# Patient Record
Sex: Male | Born: 1970 | Race: White | Hispanic: No | Marital: Married | Smoking: Never smoker
Health system: Southern US, Community
[De-identification: ages and names within clinical notes are randomized; demographics above are authoritative.]

## PROBLEM LIST (undated history)

## (undated) DIAGNOSIS — E785 Hyperlipidemia, unspecified: Secondary | ICD-10-CM

## (undated) DIAGNOSIS — F32A Depression, unspecified: Secondary | ICD-10-CM

## (undated) DIAGNOSIS — J189 Pneumonia, unspecified organism: Secondary | ICD-10-CM

## (undated) DIAGNOSIS — F329 Major depressive disorder, single episode, unspecified: Secondary | ICD-10-CM

## (undated) HISTORY — PX: VASECTOMY: SHX75

## (undated) HISTORY — PX: MYRINGOTOMY: SUR874

## (undated) HISTORY — DX: Hyperlipidemia, unspecified: E78.5

## (undated) HISTORY — DX: Major depressive disorder, single episode, unspecified: F32.9

## (undated) HISTORY — DX: Depression, unspecified: F32.A

---

## 2004-02-10 ENCOUNTER — Ambulatory Visit: Payer: Self-pay | Admitting: Internal Medicine

## 2004-03-19 ENCOUNTER — Ambulatory Visit: Payer: Self-pay | Admitting: Internal Medicine

## 2004-06-12 ENCOUNTER — Ambulatory Visit: Payer: Self-pay | Admitting: Internal Medicine

## 2005-05-08 ENCOUNTER — Ambulatory Visit: Payer: Self-pay | Admitting: Internal Medicine

## 2006-05-20 ENCOUNTER — Ambulatory Visit (HOSPITAL_COMMUNITY): Admission: RE | Admit: 2006-05-20 | Discharge: 2006-05-20 | Payer: Self-pay | Admitting: Orthopedic Surgery

## 2006-12-09 ENCOUNTER — Ambulatory Visit: Payer: Self-pay | Admitting: Internal Medicine

## 2006-12-09 DIAGNOSIS — E785 Hyperlipidemia, unspecified: Secondary | ICD-10-CM

## 2006-12-09 LAB — CONVERTED CEMR LAB
ALT: 20 units/L (ref 0–53)
AST: 21 units/L (ref 0–37)
Albumin: 4.3 g/dL (ref 3.5–5.2)
Alkaline Phosphatase: 55 units/L (ref 39–117)
BUN: 14 mg/dL (ref 6–23)
Bilirubin, Direct: 0.1 mg/dL (ref 0.0–0.3)
CO2: 26 meq/L (ref 19–32)
Calcium: 9.3 mg/dL (ref 8.4–10.5)
Chloride: 105 meq/L (ref 96–112)
Cholesterol: 146 mg/dL (ref 0–200)
Creatinine, Ser: 1 mg/dL (ref 0.4–1.5)
GFR calc Af Amer: 108 mL/min
GFR calc non Af Amer: 89 mL/min
Glucose, Bld: 132 mg/dL — ABNORMAL HIGH (ref 70–99)
HDL: 34.9 mg/dL — ABNORMAL LOW (ref >39.0)
LDL Cholesterol: 90 mg/dL (ref 0–99)
Phosphorus: 3.9 mg/dL (ref 2.3–4.6)
Potassium: 4 meq/L (ref 3.5–5.1)
Sodium: 138 meq/L (ref 135–145)
Total Bilirubin: 0.6 mg/dL (ref 0.3–1.2)
Total CHOL/HDL Ratio: 4.2
Total Protein: 6.9 g/dL (ref 6.0–8.3)
Triglycerides: 107 mg/dL (ref 0–149)
VLDL: 21 mg/dL (ref 0–40)

## 2007-01-25 ENCOUNTER — Telehealth: Payer: Self-pay | Admitting: *Deleted

## 2008-04-15 ENCOUNTER — Ambulatory Visit: Payer: Self-pay | Admitting: Internal Medicine

## 2008-04-18 ENCOUNTER — Ambulatory Visit: Payer: Self-pay | Admitting: Internal Medicine

## 2008-11-25 ENCOUNTER — Ambulatory Visit: Payer: Self-pay | Admitting: Internal Medicine

## 2008-11-25 DIAGNOSIS — F341 Dysthymic disorder: Secondary | ICD-10-CM | POA: Insufficient documentation

## 2008-11-26 ENCOUNTER — Encounter: Payer: Self-pay | Admitting: Internal Medicine

## 2008-11-26 LAB — CONVERTED CEMR LAB
ALT: 20 units/L (ref 0–53)
AST: 20 units/L (ref 0–37)
Albumin: 4.1 g/dL (ref 3.5–5.2)
Alkaline Phosphatase: 63 units/L (ref 39–117)
Bilirubin, Direct: 0 mg/dL (ref 0.0–0.3)
Cholesterol: 214 mg/dL — ABNORMAL HIGH (ref 0–200)
Total Bilirubin: 1 mg/dL (ref 0.3–1.2)
Total CHOL/HDL Ratio: 5
Triglycerides: 63 mg/dL (ref 0.0–149.0)

## 2010-02-11 ENCOUNTER — Ambulatory Visit: Payer: Self-pay | Admitting: Internal Medicine

## 2010-02-11 LAB — CONVERTED CEMR LAB
Bilirubin Urine: NEGATIVE
WBC Urine, dipstick: NEGATIVE
pH: 7

## 2010-02-13 LAB — CONVERTED CEMR LAB
ALT: 18 units/L (ref 0–53)
AST: 21 units/L (ref 0–37)
BUN: 14 mg/dL (ref 6–23)
Basophils Absolute: 0 10*3/uL (ref 0.0–0.1)
Bilirubin, Direct: 0.1 mg/dL (ref 0.0–0.3)
Calcium: 9.4 mg/dL (ref 8.4–10.5)
Cholesterol: 220 mg/dL — ABNORMAL HIGH (ref 0–200)
Direct LDL: 146 mg/dL
Eosinophils Absolute: 0 10*3/uL (ref 0.0–0.7)
Eosinophils Relative: 0.4 % (ref 0.0–5.0)
GFR calc non Af Amer: 98.79 mL/min (ref >60)
Lymphocytes Relative: 22.6 % (ref 12.0–46.0)
Lymphs Abs: 1.4 10*3/uL (ref 0.7–4.0)
MCV: 92.7 fL (ref 78.0–100.0)
Monocytes Relative: 6 % (ref 3.0–12.0)
Neutro Abs: 4.3 10*3/uL (ref 1.4–7.7)
Neutrophils Relative %: 70.5 % (ref 43.0–77.0)
Platelets: 290 10*3/uL (ref 150.0–400.0)
Potassium: 4.4 meq/L (ref 3.5–5.1)
Total Bilirubin: 0.7 mg/dL (ref 0.3–1.2)
Total Protein: 6.7 g/dL (ref 6.0–8.3)
Triglycerides: 219 mg/dL — ABNORMAL HIGH (ref 0.0–149.0)
VLDL: 43.8 mg/dL — ABNORMAL HIGH (ref 0.0–40.0)

## 2010-03-09 ENCOUNTER — Telehealth: Payer: Self-pay | Admitting: Internal Medicine

## 2010-05-05 NOTE — Progress Notes (Signed)
  Phone Note Call from Patient   Caller: Patient Call For: Birdie Sons MD Summary of Call: Pt started back on Wellbutrin and it is helping and would like to refill it. Initial call taken by: [facility] CMA AAMA,  March 09, 2010 3:47 PM  Follow-up for Phone Call        ok Follow-up by: Birdie Sons MD,  March 09, 2010 3:57 PM    New/Updated Medications: WELLBUTRIN XL 300 MG XR24H-TAB (BUPROPION HCL) one by mouth daily Prescriptions: WELLBUTRIN XL 300 MG XR24H-TAB (BUPROPION HCL) one by mouth daily  #90 x 3   Entered by:   Lynann Beaver CMA AAMA   Authorized by:   Birdie Sons MD   Signed by:   Lynann Beaver CMA AAMA on 03/09/2010   Method used:   Electronically to        [facility]. [phone]* (retail)       [address]       [place], [place]  [address]       Ph: [phone]       Fax: [phone]   RxID:   [id number]

## 2010-05-05 NOTE — Assessment & Plan Note (Signed)
Summary: CPX (PT WILL COME IN FASTING) // RS   Vital Signs:  Patient profile:   40 year old male Height:      71 inches Weight:      193 pounds BMI:     27.02 Temp:     98.6 degrees F oral Pulse rate:   84 / minute Pulse rhythm:   regular BP sitting:   106 / 72  (left arm) Cuff size:   large  Vitals Entered By: Alfred Levins, CMA (February 11, 2010 9:16 AM) CC: cpx, fasting   CC:  cpx and fasting.  History of Present Illness: cpx  Current Problems (verified): 1)  Dysthymia  (ICD-300.4) 2)  Hyperlipidemia  (ICD-272.4)  Current Medications (verified): 1)  None  Allergies (verified): No Known Drug Allergies  Past History:  Past Medical History: Last updated: 04/15/2008  Hyperlipidemia  Past Surgical History: Last updated: 11/24/2006 Myringotomy  Family History: Last updated: 02/11/2010 Family History of Cardiovascular disorder: father MIx2 with stents  Social History: Last updated: 02/11/2010 Occupation: Airline pilot Married Never Smoked Drug use-no  Risk Factors: Smoking Status: never (11/24/2006)  Family History: Family History of Cardiovascular disorder: father MIx2 with stents  Social History: Occupation: Airline pilot Married Never Smoked Drug use-no  Physical Exam  General:  well-developed well-nourished male in no acute distress. HEENT exam atraumatic, normocephalic symmetric muscles are intact. Neck is supple without lymphadenopathy, thyromegaly, jugular venous distention or carotid bruits. Chest clear to auscultation without increased work or breathing. Cardiac exam S1-S2 are regular. Abdominal exam active bowel sounds, soft, nontender. Extremities there is no clubbing cyanosis or edema. Gait is normal. Neurologic exam is alert and oriented no motor or sensory deficits noted.   Impression & Recommendations:  Problem # 1:  Preventive Health Care (ICD-V70.0)  health maint utd advised 20 pound weight loss  advised daily exercise and low calorie  diet  Orders: Venipuncture (16109) UA Dipstick w/o Micro (automated)  (81003) Specimen Handling ([address]) TLB-Lipid Panel (80061-LIPID) TLB-BMP (Basic Metabolic Panel-BMET) (80048-METABOL) TLB-CBC Platelet - w/Differential (85025-CBCD) TLB-Hepatic/Liver Function Pnl (80076-HEPATIC) TLB-TSH (Thyroid Stimulating Hormone) (84443-TSH)  Problem # 2:  HYPERLIPIDEMIA (ICD-272.4) advised regular exercise and low-fat diet. Labs Reviewed: SGOT: 20 (11/25/2008)   SGPT: 20 (11/25/2008)   HDL:42.70 (11/25/2008), 34.9 (12/09/2006)  LDL:90 (12/09/2006)  Chol:214 (11/25/2008), 146 (12/09/2006)  Trig:63.0 (11/25/2008), 107 (12/09/2006)  Problem # 3:  DYSTHYMIA (ICD-300.4) He really describes a pattern of worry. no treatment at this time  Other Orders: Admin 1st Vaccine (09811) Flu Vaccine 30yrs + [phone])   Orders Added: 1)  Admin 1st Vaccine [90471] 2)  Flu Vaccine 23yrs + [90658] 3)  Est. Patient 40-64 years [99396] 4)  Venipuncture [36415] 5)  UA Dipstick w/o Micro (automated)  [81003] 6)  Specimen Handling [99000] 7)  TLB-Lipid Panel [80061-LIPID] 8)  TLB-BMP (Basic Metabolic Panel-BMET) [80048-METABOL] 9)  TLB-CBC Platelet - w/Differential [85025-CBCD] 10)  TLB-Hepatic/Liver Function Pnl [80076-HEPATIC] 11)  TLB-TSH (Thyroid Stimulating Hormone) [29562-ZHY] Flu Vaccine Consent Questions     Do you have a history of severe allergic reactions to this vaccine? no    Any prior history of allergic reactions to egg and/or gelatin? no    Do you have a sensitivity to the preservative Thimersol? no    Do you have a past history of Guillan-Barre Syndrome? no    Do you currently have an acute febrile illness? no    Have you ever had a severe reaction to latex? no    Vaccine information given and explained  to patient? yes    Are you currently pregnant? no    Lot Number:[id number]   Exp Date:10/03/2010   Site Given  Left Deltoid IM   .[email or web address]   Laboratory Results   Urine Tests  Date/Time  Recieved: February 11, 2010 12:59 PM  Date/Time Reported: February 11, 2010 12:59 PM   Routine Urinalysis   Color: yellow Appearance: Clear Glucose: negative   (Normal Range: Negative) Bilirubin: negative   (Normal Range: Negative) Ketone: negative   (Normal Range: Negative) Spec. Gravity: 1.020   (Normal Range: 1.003-1.035) Blood: negative   (Normal Range: Negative) pH: 7.0   (Normal Range: 5.0-8.0) Protein: trace   (Normal Range: Negative) Urobilinogen: 0.2   (Normal Range: 0-1) Nitrite: negative   (Normal Range: Negative) Leukocyte Esterace: negative   (Normal Range: Negative)    Comments: Wynona Canes, CMA  February 11, 2010 12:59 PM

## 2010-07-23 ENCOUNTER — Encounter: Payer: Self-pay | Admitting: Internal Medicine

## 2010-07-24 ENCOUNTER — Ambulatory Visit (INDEPENDENT_AMBULATORY_CARE_PROVIDER_SITE_OTHER): Payer: 59 | Admitting: Internal Medicine

## 2010-07-24 ENCOUNTER — Encounter: Payer: Self-pay | Admitting: Internal Medicine

## 2010-07-24 VITALS — BP 122/80 | HR 72 | Temp 98.6°F | Ht 70.5 in | Wt 172.0 lb

## 2010-07-24 DIAGNOSIS — F341 Dysthymic disorder: Secondary | ICD-10-CM

## 2010-07-24 MED ORDER — VENLAFAXINE HCL ER 75 MG PO CP24: 75.0000 mg | ORAL_CAPSULE | Freq: Every day | ORAL | Status: AC

## 2010-07-24 MED ORDER — ALPRAZOLAM 0.25 MG PO TABS: 0.2500 mg | ORAL_TABLET | Freq: Every day | ORAL | Status: AC | PRN

## 2010-07-24 NOTE — Assessment & Plan Note (Addendum)
Will try to change meds. See new medication list. Side effects discussed. He will taper off the Wellbutrin.

## 2010-07-24 NOTE — Progress Notes (Signed)
  Subjective:    Patient ID: Devin Brown, male    DOB: 24-Oct-1970, 40 y.o.   MRN: [id number]  HPI F/u depression Feeling o.k. Mood is somewhat better but maybe not helping as much as he would like. Complains of cost of medication. Patient denies any suicidal thoughts ideations. He feels generally anxious.   Past Medical History  Diagnosis Date  . Hyperlipidemia   . Depression    Past Surgical History  Procedure Date  . Myringotomy     reports that he has never smoked. He does not have any smokeless tobacco history on file. He reports that he drinks alcohol. He reports that he does not use illicit drugs. family history includes Heart disease in his father and Hyperlipidemia in his mother. No Known Allergies   Review of Systems  patient denies chest pain, shortness of breath, orthopnea. Denies lower extremity edema, abdominal pain, change in appetite, change in bowel movements. Patient denies rashes, musculoskeletal complaints. No other specific complaints in a complete review of systems.       Objective:   Physical Exam Well-developed male in no acute distress. HEENT exam atraumatic, normocephalic, neck supple. Chest clear to auscultation, cardiac exam S1-S2 are regular. Extremities no edema.       Assessment & Plan:

## 2011-02-02 ENCOUNTER — Other Ambulatory Visit: Payer: 59

## 2011-02-16 ENCOUNTER — Encounter: Payer: 59 | Admitting: Internal Medicine

## 2011-02-24 ENCOUNTER — Encounter: Payer: 59 | Admitting: Internal Medicine

## 2014-04-28 ENCOUNTER — Emergency Department (HOSPITAL_COMMUNITY)
Admission: EM | Admit: 2014-04-28 | Discharge: 2014-04-28 | Disposition: A | Discharge status: Home / Self Care | Payer: No Typology Code available for payment source | Attending: Emergency Medicine | Admitting: Emergency Medicine

## 2014-04-28 ENCOUNTER — Encounter (HOSPITAL_COMMUNITY): Payer: Self-pay | Admitting: Emergency Medicine

## 2014-04-28 ENCOUNTER — Emergency Department (HOSPITAL_COMMUNITY): Payer: No Typology Code available for payment source

## 2014-04-28 DIAGNOSIS — S4991XA Unspecified injury of right shoulder and upper arm, initial encounter: Secondary | ICD-10-CM | POA: Diagnosis present

## 2014-04-28 DIAGNOSIS — Y9323 Activity, snow (alpine) (downhill) skiing, snow boarding, sledding, tobogganing and snow tubing: Secondary | ICD-10-CM | POA: Diagnosis not present

## 2014-04-28 DIAGNOSIS — Z8639 Personal history of other endocrine, nutritional and metabolic disease: Secondary | ICD-10-CM | POA: Diagnosis not present

## 2014-04-28 DIAGNOSIS — S43004A Unspecified dislocation of right shoulder joint, initial encounter: Secondary | ICD-10-CM

## 2014-04-28 DIAGNOSIS — Y998 Other external cause status: Secondary | ICD-10-CM | POA: Insufficient documentation

## 2014-04-28 DIAGNOSIS — Y92828 Other wilderness area as the place of occurrence of the external cause: Secondary | ICD-10-CM | POA: Insufficient documentation

## 2014-04-28 DIAGNOSIS — S43101A Unspecified dislocation of right acromioclavicular joint, initial encounter: Secondary | ICD-10-CM | POA: Insufficient documentation

## 2014-04-28 DIAGNOSIS — Z8659 Personal history of other mental and behavioral disorders: Secondary | ICD-10-CM | POA: Insufficient documentation

## 2014-04-28 DIAGNOSIS — Z79899 Other long term (current) drug therapy: Secondary | ICD-10-CM | POA: Insufficient documentation

## 2014-04-28 MED ORDER — HYDROCODONE-ACETAMINOPHEN 5-325 MG PO TABS: 1.0000 | ORAL_TABLET | Freq: Four times a day (QID) | ORAL | Status: DC | PRN

## 2014-04-28 NOTE — Discharge Instructions (Signed)
Acromioclavicular Injuries °The AC (acromioclavicular) joint is the joint in the shoulder where the collarbone (clavicle) meets the shoulder blade (scapula). The part of the shoulder blade connected to the collarbone is called the acromion. Common problems with and treatments for the AC joint are detailed below. °ARTHRITIS °Arthritis occurs when the joint has been injured and the smooth padding between the joints (cartilage) is lost. This is the wear and tear seen in most joints of the body if they have been overused. This causes the joint to produce pain and swelling which is worse with activity.  °AC JOINT SEPARATION °AC joint separation means that the ligaments connecting the acromion of the shoulder blade and collarbone have been damaged, and the two bones no longer line up. AC separations can be anywhere from mild to severe, and are "graded" depending upon which ligaments are torn and how badly they are torn. °· Grade I Injury: the least damage is done, and the AC joint still lines up. °· Grade II Injury: damage to the ligaments which reinforce the AC joint. In a Grade II injury, these ligaments are stretched but not entirely torn. When stressed, the AC joint becomes painful and unstable. °· Grade III Injury: AC and secondary ligaments are completely torn, and the collarbone is no longer attached to the shoulder blade. This results in deformity; a prominence of the end of the clavicle. °AC JOINT FRACTURE °AC joint fracture means that there has been a break in the bones of the AC joint, usually the end of the clavicle. °TREATMENT °TREATMENT OF AC ARTHRITIS °· There is currently no way to replace the cartilage damaged by arthritis. The best way to improve the condition is to decrease the activities which aggravate the problem. Application of ice to the joint helps decrease pain and soreness (inflammation). The use of non-steroidal anti-inflammatory medication is helpful. °· If less conservative measures do not  work, then cortisone shots (injections) may be used. These are anti-inflammatories; they decrease the soreness in the joint and swelling. °· If non-surgical measures fail, surgery may be recommended. The procedure is generally removal of a portion of the end of the clavicle. This is the part of the collarbone closest to your acromion which is stabilized with ligaments to the acromion of the shoulder blade. This surgery may be performed using a tube-like instrument with a light (arthroscope) for looking into a joint. It may also be performed as an open surgery through a small incision by the surgeon. Most patients will have good range of motion within 6 weeks and may return to all activity including sports by 8-12 weeks, barring complications. °TREATMENT OF AN AC SEPARATION °· The initial treatment is to decrease pain. This is best accomplished by immobilizing the arm in a sling and placing an ice pack to the shoulder for 20 to 30 minutes every 2 hours as needed. As the pain starts to subside, it is important to begin moving the fingers, wrist, elbow and eventually the shoulder in order to prevent a stiff or "frozen" shoulder. Instruction on when and how much to move the shoulder will be provided by your caregiver. The length of time needed to regain full motion and function depends on the amount or grade of the injury. Recovery from a Grade I AC separation usually takes 10 to 14 days, whereas a Grade III may take 6 to 8 weeks. °· Grade I and II separations usually do not require surgery. Even Grade III injuries usually allow return to full   activity with few restrictions. Treatment is also based on the activity demands of the injured shoulder. For example, a high level quarterback with an injured throwing arm will receive more aggressive treatment than someone with a desk job who rarely uses his/her arm for strenuous activities. In some cases, a painful lump may persist which could require a later surgery. Surgery  can be very successful, but the benefits must be weighed against the potential risks. °TREATMENT OF AN AC JOINT FRACTURE °Fracture treatment depends on the type of fracture. Sometimes a splint or sling may be all that is required. Other times surgery may be required for repair. This is more frequently the case when the ligaments supporting the clavicle are completely torn. Your caregiver will help you with these decisions and together you can decide what will be the best treatment. °HOME CARE INSTRUCTIONS  °· Apply ice to the injury for 15-20 minutes each hour while awake for 2 days. Put the ice in a plastic bag and place a towel between the bag of ice and skin. °· If a sling has been applied, wear it constantly for as long as directed by your caregiver, even at night. The sling or splint can be removed for bathing or showering or as directed. Be sure to keep the shoulder in the same place as when the sling is on. Do not lift the arm. °· If a figure-of-eight splint has been applied it should be tightened gently by another person every day. Tighten it enough to keep the shoulders held back. Allow enough room to place the index finger between the body and strap. Loosen the splint immediately if there is numbness or tingling in the hands. °· Take over-the-counter or prescription medicines for pain, discomfort or fever as directed by your caregiver. °· If you or your child has received a follow up appointment, it is very important to keep that appointment in order to avoid long term complications, chronic pain or disability. °SEEK MEDICAL CARE IF:  °· The pain is not relieved with medications. °· There is increased swelling or discoloration that continues to get worse rather than better. °· You or your child has been unable to follow up as instructed. °· There is progressive numbness and tingling in the arm, forearm or hand. °SEEK IMMEDIATE MEDICAL CARE IF:  °· The arm is numb, cold or pale. °· There is increasing pain  in the hand, forearm or fingers. °MAKE SURE YOU:  °· Understand these instructions. °· Will watch your condition. °· Will get help right away if you are not doing well or get worse. °Document Released: 12/30/2004 Document Revised: 06/14/2011 Document Reviewed: 06/24/2008 °ExitCare® Patient Information ©2015 [employer]. This information is not intended to replace advice given to you by your health care provider. Make sure you discuss any questions you have with your health care provider. ° °

## 2014-04-28 NOTE — ED Notes (Signed)
Skiing Friday, went head over heels, landed on right shoulder. [place] and has not been seen for shoulder yet. Pt states he feels like the bone is sticking up out of his shoulder, slight deformity to rt shoulder joint.

## 2014-04-28 NOTE — ED Notes (Signed)
Called pt for room. No answer.  

## 2014-04-28 NOTE — ED Provider Notes (Signed)
CSN: [id number]     Arrival date & time 04/28/14  1841 History   First MD Initiated Contact with Patient 04/28/14 2107     Chief Complaint  Patient presents with  . Shoulder Injury     (Consider location/radiation/quality/duration/timing/severity/associated sxs/prior Treatment) HPI Comments: Patient resents emergency department with chief complaint of right shoulder pain. States that he fell while skiing on Friday. Complains of right shoulder pain with palpation and movement. He has been managing his symptoms with OTC pain medicines. He is able to move his arm. Denies any other injuries. States pain is mild to moderate.  The history is provided by the patient. No language interpreter was used.    Past Medical History  Diagnosis Date  . Hyperlipidemia   . Depression    Past Surgical History  Procedure Laterality Date  . Myringotomy     Family History  Problem Relation Age of Onset  . Heart disease Father     MI x2 with stents  . Hyperlipidemia Mother    History  Substance Use Topics  . Smoking status: Never Smoker   . Smokeless tobacco: Not on file  . Alcohol Use: Yes    Review of Systems  Constitutional: Negative for fever and chills.  Respiratory: Negative for shortness of breath.   Cardiovascular: Negative for chest pain.  Gastrointestinal: Negative for nausea, vomiting, diarrhea and constipation.  Genitourinary: Negative for dysuria.  Musculoskeletal: Positive for arthralgias.  All other systems reviewed and are negative.     Allergies  Review of patient's allergies indicates no known allergies.  Home Medications   Prior to Admission medications   Medication Sig Start Date End Date Taking? Authorizing Provider  HYDROcodone-acetaminophen (NORCO/VICODIN) 5-325 MG per tablet Take 1-2 tablets by mouth every 6 (six) hours as needed for moderate pain or severe pain. 04/28/14   Roxy Horseman, PA-C  venlafaxine (EFFEXOR XR) 75 MG 24 hr capsule Take 1 capsule (75  mg total) by mouth daily. 07/24/10 07/24/11  Valetta Mole Swords, MD   BP 151/86 mmHg  Pulse 95  Temp(Src) 98.5 F (36.9 C) (Oral)  Resp 18  SpO2 98% Physical Exam  Constitutional: He is oriented to person, place, and time. He appears well-developed and well-nourished.  HENT:  Head: Normocephalic and atraumatic.  Eyes: Conjunctivae and EOM are normal.  Neck: Normal range of motion.  Cardiovascular: Normal rate and intact distal pulses.   Pulmonary/Chest: Effort normal.  Abdominal: He exhibits no distension.  Musculoskeletal: Normal range of motion.  Right clavicle mildly elevated in relation to the remaining shoulder joint, concerning for [facility] joint separation, no other bony abnormality or deformity, range of motion and strength is intact, but patient does exhibit pain with overhead movement and putting hand behind back  Neurological: He is alert and oriented to person, place, and time.  Sensation and strength intact  Skin: Skin is dry.  Psychiatric: He has a normal mood and affect. His behavior is normal. Judgment and thought content normal.  Nursing note and vitals reviewed.   ED Course  Procedures (including critical care time) Labs Review Labs Reviewed - No data to display  Imaging Review Dg Shoulder Right  04/28/2014   CLINICAL DATA:  Initial evaluation for recent skiing injury. Trauma. Now with right shoulder pain.  EXAM: RIGHT SHOULDER - 2+ VIEW  COMPARISON:  None.  FINDINGS: No acute fracture identified. The humeral head in normal alignment with the glenoid. There is elevation of the distal clavicle relative to the acromion with widening  of the cortical clavicular distance to 26 mm. Findings suggestive of acromioclavicular separation/dislocation. No soft tissue abnormality. No radiopaque foreign body. Partially visualized right hemi thorax grossly clear.  IMPRESSION: 1. Elevation of the distal clavicle relative to the acromion with widening of the coracoclavicular distance, most  consistent with [facility] separation/dislocation (type III). 2. No acute fracture.   Electronically Signed   By: Rise MuBenjamin  McClintock M.D.   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 Will place patient a sling. Recommend orthopedic follow-up. Will treat the patient with Vicodin. Recommend NSAIDs and ice. Patient understands agrees with the plan. Instructed the patient to range his shoulder as tolerable twice a day to avoid frozen shoulder.    Roxy Horsemanobert Ryot Burrous, PA-C 04/28/14 [id number]  Purvis SheffieldForrest Harrison, MD 04/29/14 2230

## 2014-05-01 ENCOUNTER — Encounter (HOSPITAL_COMMUNITY): Payer: Self-pay | Admitting: *Deleted

## 2014-05-01 NOTE — Anesthesia Preprocedure Evaluation (Addendum)
Anesthesia Evaluation  Patient identified by MRN, date of birth, ID band Patient awake    Reviewed: Allergy & Precautions, NPO status , Patient's Chart, lab work & pertinent test results, reviewed documented beta blocker date and time   Airway Mallampati: II   Neck ROM: Full    Dental  (+) Teeth Intact, Dental Advisory Given   Pulmonary neg pulmonary ROS,  breath sounds clear to auscultation        Cardiovascular negative cardio ROS  Rhythm:Regular     Neuro/Psych    GI/Hepatic negative GI ROS, Neg liver ROS,   Endo/Other  negative endocrine ROS  Renal/GU negative Renal ROS     Musculoskeletal   Abdominal (+)  Abdomen: soft.    Peds  Hematology negative hematology ROS (+)   Anesthesia Other Findings   Reproductive/Obstetrics                            Anesthesia Physical Anesthesia Plan  ASA: I  Anesthesia Plan: General   Post-op Pain Management: MAC Combined w/ Regional for Post-op pain   Induction: Intravenous  Airway Management Planned: Oral ETT  Additional Equipment:   Intra-op Plan:   Post-operative Plan: Extubation in OR  Informed Consent: I have reviewed the patients History and Physical, chart, labs and discussed the procedure including the risks, benefits and alternatives for the proposed anesthesia with the patient or authorized representative who has indicated his/her understanding and acceptance.     Plan Discussed with:   Anesthesia Plan Comments: (Check am labs)       Anesthesia Quick Evaluation

## 2014-05-01 NOTE — Progress Notes (Signed)
No pre-op orders in EPIC. Called Dr. Dub MikesSupple's office requesting orders. Left message.

## 2014-05-02 ENCOUNTER — Ambulatory Visit (HOSPITAL_COMMUNITY): Payer: No Typology Code available for payment source

## 2014-05-02 ENCOUNTER — Ambulatory Visit (HOSPITAL_COMMUNITY)
Admission: RE | Admit: 2014-05-02 | Discharge: 2014-05-02 | Disposition: A | Discharge status: Home / Self Care | Payer: No Typology Code available for payment source | Source: Ambulatory Visit | Attending: Orthopedic Surgery | Admitting: Orthopedic Surgery

## 2014-05-02 ENCOUNTER — Encounter (HOSPITAL_COMMUNITY): Payer: Self-pay | Admitting: *Deleted

## 2014-05-02 ENCOUNTER — Encounter (HOSPITAL_COMMUNITY)
Admission: RE | Disposition: A | Discharge status: Home / Self Care | Payer: Self-pay | Source: Ambulatory Visit | Attending: Orthopedic Surgery

## 2014-05-02 ENCOUNTER — Ambulatory Visit (HOSPITAL_COMMUNITY): Payer: No Typology Code available for payment source | Admitting: Anesthesiology

## 2014-05-02 DIAGNOSIS — W19XXXA Unspecified fall, initial encounter: Secondary | ICD-10-CM | POA: Insufficient documentation

## 2014-05-02 DIAGNOSIS — S43121A Dislocation of right acromioclavicular joint, 100%-200% displacement, initial encounter: Secondary | ICD-10-CM | POA: Insufficient documentation

## 2014-05-02 DIAGNOSIS — Y9323 Activity, snow (alpine) (downhill) skiing, snow boarding, sledding, tobogganing and snow tubing: Secondary | ICD-10-CM | POA: Insufficient documentation

## 2014-05-02 DIAGNOSIS — F329 Major depressive disorder, single episode, unspecified: Secondary | ICD-10-CM | POA: Diagnosis not present

## 2014-05-02 DIAGNOSIS — E785 Hyperlipidemia, unspecified: Secondary | ICD-10-CM | POA: Insufficient documentation

## 2014-05-02 DIAGNOSIS — Z419 Encounter for procedure for purposes other than remedying health state, unspecified: Secondary | ICD-10-CM

## 2014-05-02 HISTORY — PX: ACROMIO-CLAVICULAR JOINT REPAIR: SHX5183

## 2014-05-02 HISTORY — DX: Pneumonia, unspecified organism: J18.9

## 2014-05-02 LAB — COMPREHENSIVE METABOLIC PANEL
ALBUMIN: 3.7 g/dL (ref 3.5–5.2)
ALT: 26 U/L (ref 0–53)
ANION GAP: 8 (ref 5–15)
AST: 27 U/L (ref 0–37)
Alkaline Phosphatase: 50 U/L (ref 39–117)
BUN: 11 mg/dL (ref 6–23)
CALCIUM: 8.8 mg/dL (ref 8.4–10.5)
CHLORIDE: 104 mmol/L (ref 96–112)
CO2: 26 mmol/L (ref 19–32)
Creatinine, Ser: 0.93 mg/dL (ref 0.50–1.35)
GFR calc non Af Amer: >90 mL/min (ref >90)
Glucose, Bld: 97 mg/dL (ref 70–99)
POTASSIUM: 3.8 mmol/L (ref 3.5–5.1)
SODIUM: 138 mmol/L (ref 135–145)
Total Bilirubin: 0.5 mg/dL (ref 0.3–1.2)
Total Protein: 6.6 g/dL (ref 6.0–8.3)

## 2014-05-02 LAB — CBC WITH DIFFERENTIAL/PLATELET
Basophils Absolute: 0 10*3/uL (ref 0.0–0.1)
Basophils Relative: 0 % (ref 0–1)
Eosinophils Absolute: 0.2 10*3/uL (ref 0.0–0.7)
Eosinophils Relative: 2 % (ref 0–5)
HCT: 42.8 % (ref 39.0–52.0)
HEMOGLOBIN: 15.3 g/dL (ref 13.0–17.0)
Lymphocytes Relative: 37 % (ref 12–46)
Lymphs Abs: 2.5 10*3/uL (ref 0.7–4.0)
MCH: 31.6 pg (ref 26.0–34.0)
MCHC: 35.7 g/dL (ref 30.0–36.0)
MCV: 88.4 fL (ref 78.0–100.0)
Monocytes Absolute: 0.6 10*3/uL (ref 0.1–1.0)
Monocytes Relative: 9 % (ref 3–12)
NEUTROS ABS: 3.5 10*3/uL (ref 1.7–7.7)
Neutrophils Relative %: 52 % (ref 43–77)
Platelets: 255 10*3/uL (ref 150–400)
RBC: 4.84 MIL/uL (ref 4.22–5.81)
RDW: 12.5 % (ref 11.5–15.5)
WBC: 6.8 10*3/uL (ref 4.0–10.5)

## 2014-05-02 LAB — PROTIME-INR
INR: 1.05 (ref 0.00–1.49)
Prothrombin Time: 13.9 seconds (ref 11.6–15.2)

## 2014-05-02 LAB — APTT: aPTT: 26 seconds (ref 24–37)

## 2014-05-02 SURGERY — REPAIR, ACROMIOCLAVICULAR JOINT
Anesthesia: General | Site: Shoulder | Laterality: Right

## 2014-05-02 MED ORDER — FENTANYL CITRATE 0.05 MG/ML IJ SOLN
INTRAMUSCULAR | Status: AC
  Filled 2014-05-02: qty 5

## 2014-05-02 MED ORDER — DEXAMETHASONE SODIUM PHOSPHATE 4 MG/ML IJ SOLN
INTRAMUSCULAR | Status: AC
  Filled 2014-05-02: qty 2

## 2014-05-02 MED ORDER — ONDANSETRON HCL 4 MG/2ML IJ SOLN
INTRAMUSCULAR | Status: AC
  Filled 2014-05-02: qty 4

## 2014-05-02 MED ORDER — ROCURONIUM BROMIDE 50 MG/5ML IV SOLN
INTRAVENOUS | Status: AC
  Filled 2014-05-02: qty 1

## 2014-05-02 MED ORDER — PROPOFOL 10 MG/ML IV BOLUS
INTRAVENOUS | Status: AC
  Filled 2014-05-02: qty 20

## 2014-05-02 MED ORDER — CEFAZOLIN SODIUM-DEXTROSE 2-3 GM-% IV SOLR
2.0000 g | INTRAVENOUS | Status: AC
  Administered 2014-05-02: 2 g via INTRAVENOUS

## 2014-05-02 MED ORDER — LIDOCAINE HCL (CARDIAC) 20 MG/ML IV SOLN
INTRAVENOUS | Status: DC | PRN
  Administered 2014-05-02: 100 mg via INTRAVENOUS

## 2014-05-02 MED ORDER — FENTANYL CITRATE 0.05 MG/ML IJ SOLN: 25.0000 ug | INTRAMUSCULAR | Status: DC | PRN

## 2014-05-02 MED ORDER — PROPOFOL 10 MG/ML IV BOLUS
INTRAVENOUS | Status: DC | PRN
  Administered 2014-05-02: 200 mg via INTRAVENOUS

## 2014-05-02 MED ORDER — FENTANYL CITRATE 0.05 MG/ML IJ SOLN
INTRAMUSCULAR | Status: AC
  Administered 2014-05-02: 100 ug via INTRAVENOUS
  Filled 2014-05-02: qty 2

## 2014-05-02 MED ORDER — ONDANSETRON HCL 4 MG/2ML IJ SOLN
INTRAMUSCULAR | Status: DC | PRN
  Administered 2014-05-02: 4 mg via INTRAVENOUS

## 2014-05-02 MED ORDER — PHENYLEPHRINE HCL 10 MG/ML IJ SOLN
10.0000 mg | INTRAVENOUS | Status: DC | PRN
  Administered 2014-05-02: 40 ug/min via INTRAVENOUS

## 2014-05-02 MED ORDER — DIAZEPAM 5 MG PO TABS: 2.5000 mg | ORAL_TABLET | Freq: Four times a day (QID) | ORAL | Status: AC | PRN

## 2014-05-02 MED ORDER — BUPIVACAINE-EPINEPHRINE (PF) 0.5% -1:200000 IJ SOLN
INTRAMUSCULAR | Status: DC | PRN
  Administered 2014-05-02: 25 mL via PERINEURAL

## 2014-05-02 MED ORDER — ROCURONIUM BROMIDE 100 MG/10ML IV SOLN
INTRAVENOUS | Status: DC | PRN
  Administered 2014-05-02: 10 mg via INTRAVENOUS
  Administered 2014-05-02: 40 mg via INTRAVENOUS

## 2014-05-02 MED ORDER — LACTATED RINGERS IV SOLN: INTRAVENOUS | Status: DC

## 2014-05-02 MED ORDER — DEXAMETHASONE SODIUM PHOSPHATE 4 MG/ML IJ SOLN
INTRAMUSCULAR | Status: DC | PRN
  Administered 2014-05-02: 8 mg via INTRAVENOUS

## 2014-05-02 MED ORDER — MIDAZOLAM HCL 2 MG/2ML IJ SOLN
1.0000 mg | INTRAMUSCULAR | Status: DC | PRN
  Administered 2014-05-02: 2 mg via INTRAVENOUS

## 2014-05-02 MED ORDER — 0.9 % SODIUM CHLORIDE (POUR BTL) OPTIME
TOPICAL | Status: DC | PRN
  Administered 2014-05-02: 1000 mL

## 2014-05-02 MED ORDER — MIDAZOLAM HCL 2 MG/2ML IJ SOLN
INTRAMUSCULAR | Status: AC
  Administered 2014-05-02: 2 mg via INTRAVENOUS
  Filled 2014-05-02: qty 2

## 2014-05-02 MED ORDER — PHENYLEPHRINE HCL 10 MG/ML IJ SOLN
INTRAMUSCULAR | Status: DC | PRN
  Administered 2014-05-02 (×3): 80 ug via INTRAVENOUS
  Administered 2014-05-02: 40 ug via INTRAVENOUS

## 2014-05-02 MED ORDER — FENTANYL CITRATE 0.05 MG/ML IJ SOLN
50.0000 ug | INTRAMUSCULAR | Status: DC | PRN
  Administered 2014-05-02: 100 ug via INTRAVENOUS

## 2014-05-02 MED ORDER — FENTANYL CITRATE 0.05 MG/ML IJ SOLN
INTRAMUSCULAR | Status: DC | PRN
  Administered 2014-05-02: 100 ug via INTRAVENOUS

## 2014-05-02 MED ORDER — LIDOCAINE HCL (CARDIAC) 20 MG/ML IV SOLN
INTRAVENOUS | Status: AC
  Filled 2014-05-02: qty 5

## 2014-05-02 MED ORDER — OXYCODONE-ACETAMINOPHEN 5-325 MG PO TABS: 1.0000 | ORAL_TABLET | ORAL | Status: AC | PRN

## 2014-05-02 MED ORDER — MEPERIDINE HCL 25 MG/ML IJ SOLN: 6.2500 mg | INTRAMUSCULAR | Status: DC | PRN

## 2014-05-02 MED ORDER — LACTATED RINGERS IV SOLN
INTRAVENOUS | Status: DC
  Administered 2014-05-02: 12:00:00 via INTRAVENOUS
  Administered 2014-05-02: 35 mL/h via INTRAVENOUS

## 2014-05-02 MED ORDER — PROMETHAZINE HCL 25 MG/ML IJ SOLN: 6.2500 mg | INTRAMUSCULAR | Status: DC | PRN

## 2014-05-02 SURGICAL SUPPLY — 64 items
ADH SKN CLS APL DERMABOND .7 (GAUZE/BANDAGES/DRESSINGS)
BLADE LONG MED 31MMX9MM (MISCELLANEOUS)
BLADE LONG MED 31X9 (MISCELLANEOUS) ×1 IMPLANT
BOOTCOVER CLEANROOM LRG (PROTECTIVE WEAR) ×6 IMPLANT
CLOSURE WOUND 1/2 X4 (GAUZE/BANDAGES/DRESSINGS) ×1
DERMABOND ADVANCED (GAUZE/BANDAGES/DRESSINGS)
DERMABOND ADVANCED .7 DNX12 (GAUZE/BANDAGES/DRESSINGS) ×1 IMPLANT
DRAPE C-ARM 42X72 X-RAY (DRAPES) ×3 IMPLANT
DRAPE IMP U-DRAPE 54X76 (DRAPES) ×3 IMPLANT
DRAPE INCISE IOBAN 66X45 STRL (DRAPES) ×1 IMPLANT
DRAPE SURG 17X11 SM STRL (DRAPES) ×3 IMPLANT
DRAPE U-SHAPE 47X51 STRL (DRAPES) ×3 IMPLANT
DRSG MEPILEX BORDER 4X4 (GAUZE/BANDAGES/DRESSINGS) ×3 IMPLANT
DRSG PAD ABDOMINAL 8X10 ST (GAUZE/BANDAGES/DRESSINGS) ×2 IMPLANT
DURAPREP 26ML APPLICATOR (WOUND CARE) ×3 IMPLANT
ELECT REM PT RETURN 9FT ADLT (ELECTROSURGICAL) ×3
ELECTRODE REM PT RTRN 9FT ADLT (ELECTROSURGICAL) ×1 IMPLANT
GAUZE SPONGE 4X4 12PLY STRL (GAUZE/BANDAGES/DRESSINGS) ×1 IMPLANT
GLOVE BIO SURGEON STRL SZ7.5 (GLOVE) ×3 IMPLANT
GLOVE BIO SURGEON STRL SZ8 (GLOVE) ×3 IMPLANT
GLOVE EUDERMIC 7 POWDERFREE (GLOVE) ×3 IMPLANT
GLOVE SS BIOGEL STRL SZ 7.5 (GLOVE) ×1 IMPLANT
GLOVE SUPERSENSE BIOGEL SZ 7.5 (GLOVE) ×2
GOWN STRL REUS W/ TWL LRG LVL3 (GOWN DISPOSABLE) ×1 IMPLANT
GOWN STRL REUS W/ TWL XL LVL3 (GOWN DISPOSABLE) ×4 IMPLANT
GOWN STRL REUS W/TWL LRG LVL3 (GOWN DISPOSABLE) ×6
GOWN STRL REUS W/TWL XL LVL3 (GOWN DISPOSABLE) ×6
GRAFT TISS SEMITEND 4-8 (Bone Implant) IMPLANT
KIT AC JOINT DISP (KITS) ×2 IMPLANT
KIT BASIN OR (CUSTOM PROCEDURE TRAY) ×3 IMPLANT
KIT BIO-TENODESIS 3X8 DISP (MISCELLANEOUS)
KIT INSRT BABSR STRL DISP BTN (MISCELLANEOUS) IMPLANT
KIT ROOM TURNOVER OR (KITS) ×3 IMPLANT
MANIFOLD NEPTUNE II (INSTRUMENTS) ×3 IMPLANT
NDL SUT 6 .5 CRC .975X.05 MAYO (NEEDLE) IMPLANT
NEEDLE MAYO TAPER (NEEDLE) ×3
NS IRRIG 1000ML POUR BTL (IV SOLUTION) ×3 IMPLANT
PACK SHOULDER (CUSTOM PROCEDURE TRAY) ×3 IMPLANT
PACK UNIVERSAL I (CUSTOM PROCEDURE TRAY) ×1 IMPLANT
PAD ARMBOARD 7.5X6 YLW CONV (MISCELLANEOUS) ×6 IMPLANT
SLING ARM FOAM STRAP LRG (SOFTGOODS) ×2 IMPLANT
SLING ARM IMMOBILIZER LRG (SOFTGOODS) ×3 IMPLANT
SLING ARM IMMOBILIZER MED (SOFTGOODS) IMPLANT
SLING ARM LRG ADULT FOAM STRAP (SOFTGOODS) ×3 IMPLANT
SPONGE LAP 4X18 X RAY DECT (DISPOSABLE) ×1 IMPLANT
STRIP CLOSURE SKIN 1/2X4 (GAUZE/BANDAGES/DRESSINGS) ×2 IMPLANT
SUCTION FRAZIER TIP 10 FR DISP (SUCTIONS) ×3 IMPLANT
SUT 2 FIBERLOOP 20 STRT BLUE (SUTURE) ×3
SUT FIBERWIRE #2 38 T-5 BLUE (SUTURE) ×3
SUT MNCRL AB 3-0 PS2 18 (SUTURE) ×3 IMPLANT
SUT RETRIEVER MED (INSTRUMENTS) ×2 IMPLANT
SUT VIC AB 1 CT1 27 (SUTURE)
SUT VIC AB 1 CT1 27XBRD ANBCTR (SUTURE) ×1 IMPLANT
SUT VIC AB 2-0 CT1 27 (SUTURE) ×6
SUT VIC AB 2-0 CT1 TAPERPNT 27 (SUTURE) ×1 IMPLANT
SUTURE 2 FIBERLOOP 20 STRT BLU (SUTURE) ×1 IMPLANT
SUTURE FIBERWR #2 38 T-5 BLUE (SUTURE) ×1 IMPLANT
SYR 20CC LL (SYRINGE) ×1 IMPLANT
TAPE PAPER 3X10 WHT MICROPORE (GAUZE/BANDAGES/DRESSINGS) ×1 IMPLANT
TENDON SEMI-TENDINOSUS (Bone Implant) ×3 IMPLANT
TOWEL OR 17X24 6PK STRL BLUE (TOWEL DISPOSABLE) ×3 IMPLANT
TOWEL OR 17X26 10 PK STRL BLUE (TOWEL DISPOSABLE) ×3 IMPLANT
WATER STERILE IRR 1000ML POUR (IV SOLUTION) ×1 IMPLANT
ZIPLOOP FIXATION AC JOINT DBL (Orthopedic Implant) ×2 IMPLANT

## 2014-05-02 NOTE — Discharge Instructions (Signed)
You may shower on day 5 You may change dressing before then if needed, if current dressing is dry, leave in place until first shower. After shower replace with clean dry dressing. Ok to allow arm to come out of sling to dangle and to move elbow wrist and hand. Ice to shoulder as needed.  What to eat:  For your first meals, you should eat lightly; only small meals initially.  If you do not have nausea, you may eat larger meals.  Avoid spicy, greasy and heavy food.    General Anesthesia, Adult, Care After  Refer to this sheet in the next few weeks. These instructions provide you with information on caring for yourself after your procedure. Your health care provider may also give you more specific instructions. Your treatment has been planned according to current medical practices, but problems sometimes occur. Call your health care provider if you have any problems or questions after your procedure.  WHAT TO EXPECT AFTER THE PROCEDURE  After the procedure, it is typical to experience:  Sleepiness.  Nausea and vomiting. HOME CARE INSTRUCTIONS  For the first 24 hours after general anesthesia:  Have a responsible person with you.  Do not drive a car. If you are alone, do not take public transportation.  Do not drink alcohol.  Do not take medicine that has not been prescribed by your health care provider.  Do not sign important papers or make important decisions.  You may resume a normal diet and activities as directed by your health care provider.  Change bandages (dressings) as directed.  If you have questions or problems that seem related to general anesthesia, call the hospital and ask for the anesthetist or anesthesiologist on call. SEEK MEDICAL CARE IF:  You have nausea and vomiting that continue the day after anesthesia.  You develop a rash. SEEK IMMEDIATE MEDICAL CARE IF:  You have difficulty breathing.  You have chest pain.  You have any allergic problems. Document Released:  06/28/2000 Document Revised: 11/22/2012 Document Reviewed: 10/05/2012  [facility] Patient Information 2014 [employer], [place].

## 2014-05-02 NOTE — Anesthesia Procedure Notes (Addendum)
Anesthesia Regional Block:  Interscalene brachial plexus block  Pre-Anesthetic Checklist: ,, timeout performed, Correct Patient, Correct Site, Correct Laterality, Correct Procedure, Correct Position, site marked, Risks and benefits discussed,  Surgical consent,  Pre-op evaluation,  At surgeon's request and post-op pain management  Laterality: Right  Prep: Maximum Sterile Barrier Precautions used and chloraprep       Needles:  Injection technique: Single-shot  Needle Type: Echogenic Stimulator Needle     Needle Length: 10cm 10 cm Needle Gauge: 22 and 22 G    Additional Needles:  Procedures: ultrasound guided (picture in chart) and nerve stimulator Interscalene brachial plexus block  Nerve Stimulator or Paresthesia:  Response: 0.28 mA,   Additional Responses:   Narrative:  Start time: 05/02/2014 8:36 AM End time: 05/02/2014 8:48 AM  Performed by: Personally  Anesthesiologist: Sebastian AcheMANNY, THEODORE  Additional Notes: R IS block with 25ml .5% marcaine with epi, US and stimulator, talked to patient throughout, multiple asp, no complications   Procedure Name: Intubation Date/Time: 05/02/2014 9:50 AM Performed by: Charm BargesBUTLER, Reann Dobias R Pre-anesthesia Checklist: Patient identified, Emergency Drugs available, Suction available, Patient being monitored and Timeout performed Patient Re-evaluated:Patient Re-evaluated prior to inductionOxygen Delivery Method: Circle system utilized Preoxygenation: Pre-oxygenation with 100% oxygen Intubation Type: IV induction Ventilation: Mask ventilation without difficulty Laryngoscope Size: Mac and 4 Grade View: Grade II Tube type: Oral Tube size: 8.0 mm Number of attempts: 1 Airway Equipment and Method: Stylet Placement Confirmation: ETT inserted through vocal cords under direct vision,  positive ETCO2 and breath sounds checked- equal and bilateral Secured at: 22 cm Tube secured with: Tape Dental Injury: Teeth and Oropharynx as per pre-operative  assessment

## 2014-05-02 NOTE — H&P (Signed)
Devin BatheJames Andrew Brown    Chief Complaint: right ac separation HPI: The patient is a 44 y.o. male with unstable and severely displaced right grade 3+ AC joint separation after fall while snow skiing   Past Medical History  Diagnosis Date  . Hyperlipidemia   . Depression     took meds to help him sleep  . Pneumonia     "double pneumonia"    Past Surgical History  Procedure Laterality Date  . Myringotomy      as a child  . Vasectomy      Family History  Problem Relation Age of Onset  . Hyperlipidemia Father   . Heart disease Father     MI x2 with stents  . CAD Father   . Hyperlipidemia Mother     Social History:  reports that he has never smoked. He has never used smokeless tobacco. He reports that he drinks alcohol. He reports that he does not use illicit drugs.  Allergies: No Known Allergies  Medications Prior to Admission  Medication Sig Dispense Refill  . atorvastatin (LIPITOR) 10 MG tablet Take 10 mg by mouth daily.   0  . HYDROcodone-acetaminophen (NORCO/VICODIN) 5-325 MG per tablet Take 1-2 tablets by mouth every 6 (six) hours as needed for moderate pain or severe pain. 15 tablet 0  . ibuprofen (ADVIL,MOTRIN) 200 MG tablet Take 600 mg by mouth every 6 (six) hours as needed (pain).    . Multiple Vitamin (MULTIVITAMIN WITH MINERALS) TABS tablet Take 1 tablet by mouth daily.    Marland Kitchen. testosterone cypionate (DEPOTESTOTERONE CYPIONATE) 200 MG/ML injection Inject 200 mg into the muscle every 14 (fourteen) days. Last injection 04/24/2014    . venlafaxine (EFFEXOR XR) 75 MG 24 hr capsule Take 1 capsule (75 mg total) by mouth daily. 30 capsule 2     Physical Exam: right shoulder with marked prominence and instability of AC joint, n/v intact  Vitals  Temp:  [98.1 F (36.7 C)] 98.1 F (36.7 C) (01/28 0757) Pulse Rate:  [64-97] 86 (01/28 0900) Resp:  [12-18] 14 (01/28 0900) BP: (139-148)/(82-93) 148/83 mmHg (01/28 0900) SpO2:  [97 %-100 %] 97 % (01/28 0900) Weight:  [90.719  kg (200 lb)] 90.719 kg (200 lb) (01/28 0757)  Assessment/Plan  Impression: right ac separation  Plan of Action: Procedure(s): RIGHT SHOUDLER ACROMIO-CLAVICULAR JOINT RECONSTRUCTION  Hasson Gaspard M 05/02/2014, 9:08 AM

## 2014-05-02 NOTE — Op Note (Signed)
05/02/2014  11:31 AM  PATIENT:   Devin Brown  44 y.o. male  PRE-OPERATIVE DIAGNOSIS:  Unstable right grade III ac separation  POST-OPERATIVE DIAGNOSIS:  same  PROCEDURE:  R shoulder reconstruction of coracoclavicular ligaments  SURGEON:  Shakhia Gramajo, Vania ReaKevin M. M.D.  ASSISTANTS: Shuford pac   ANESTHESIA:   GET + ISB  EBL: <50cc  SPECIMEN:  none  Drains: none   PATIENT DISPOSITION:  PACU - hemodynamically stable.    PLAN OF CARE: Discharge to home after PACU  Dictation# [id number]

## 2014-05-02 NOTE — Anesthesia Postprocedure Evaluation (Signed)
  Anesthesia Post-op Note  Patient: Devin BatheJames Andrew Brown  Procedure(s) Performed: Procedure(s): RIGHT SHOUDLER ACROMIO-CLAVICULAR JOINT RECONSTRUCTION (Right)  Patient Location: PACU  Anesthesia Type:General  Level of Consciousness: awake, alert  and oriented  Airway and Oxygen Therapy: Patient Spontanous Breathing and Patient connected to nasal cannula oxygen  Post-op Pain: none  Post-op Assessment: Post-op Vital signs reviewed, Patient's Cardiovascular Status Stable, Respiratory Function Stable, Patent Airway and No signs of Nausea or vomiting  Post-op Vital Signs: Reviewed and stable  Last Vitals:  Filed Vitals:   05/02/14 1145  BP: 140/92  Pulse: 80  Temp: 36.4 C  Resp: 15    Complications: No apparent anesthesia complications

## 2014-05-02 NOTE — Transfer of Care (Signed)
Immediate Anesthesia Transfer of Care Note  Patient: Devin BatheJames Andrew Brown  Procedure(s) Performed: Procedure(s): RIGHT SHOUDLER ACROMIO-CLAVICULAR JOINT RECONSTRUCTION (Right)  Patient Location: PACU  Anesthesia Type:GA combined with regional for post-op pain  Level of Consciousness: awake, alert  and oriented  Airway & Oxygen Therapy: Patient Spontanous Breathing and Patient connected to nasal cannula oxygen  Post-op Assessment: Report given to PACU RN, Post -op Vital signs reviewed and stable and Patient moving all extremities  Post vital signs: Reviewed and stable  Last Vitals:  Filed Vitals:   05/02/14 0900  BP: 148/83  Pulse: 86  Temp:   Resp: 14    Complications: No apparent anesthesia complications

## 2014-05-03 ENCOUNTER — Encounter (HOSPITAL_COMMUNITY): Payer: Self-pay | Admitting: Orthopedic Surgery

## 2014-05-03 NOTE — Op Note (Signed)
NAME:  Devin HughesDUVALL, Milferd                ACCOUNT NO.:  [id number]  MEDICAL RECORD NO.:  [id number]  LOCATION:  [facility]                         FACILITY:  [facility]  PHYSICIAN:  Vania ReaKevin M. Clydean Posas, M.D.  DATE OF BIRTH:  February 12, 1971  DATE OF PROCEDURE:  05/02/2014 DATE OF DISCHARGE:  05/02/2014                              OPERATIVE REPORT   PREOPERATIVE DIAGNOSIS:  Unstable right shoulder grade 3 acromioclavicular joint separation.  POSTOPERATIVE DIAGNOSIS:  Unstable right shoulder grade 3 acromioclavicular joint separation.  PROCEDURE:  Open reconstruction of right shoulder coracoclavicular ligaments using a ZipLoop and allograft construct.  SURGEON:  Vania ReaKevin M. Marshaun Lortie, M.D.  Threasa HeadsASSISTANFrench Ana:  Tracy A. Shuford, P.A.-C.  ANESTHESIA:  General endotracheal as well as an interscalene block.  ESTIMATED BLOOD LOSS:  Less than 50 mL.  DRAINS:  None.  HISTORY:  Mr. Buzzy HanDuvall is a 44 year old gentleman who fell off skiing this past week and sustaining a severely displaced and unstable right shoulder grade 3+ AC separation with marked instability and deformity. On examination in the office, he was found to have marked prominence of the distal clavicle, his skin was neurovascularly intact distally with grossly intact rotator cuff by clinical exam, but he had severe instability in [facility] joint and marked displacement on plain radiographs.  I discussed with he and his wife at length the maturation, long-term prognosis and treatment options.  We reviewed the technical details of AC joint repair/reconstruction and risks versus benefits thereof. Possible surgical complications were all reviewed including the potential bleeding, infection, neurovascular injury, persistent pain, loss of motion, anesthetic complication, recurrence of deformity, recurrence of instability, anesthetic complication and possible need for additional surgery.  He understands and accepts and agrees to planned procedure.  PROCEDURE IN DETAIL:   After undergoing routine preop evaluation, the patient received prophylactic antibiotics.  An interscalene block was established in the holding area by the Anesthesia Department.  Placed supine on the operating table, underwent smooth induction of a general endotracheal anesthesia.  Placed into beach-chair position and appropriately padded and protected.  The right shoulder girdle region was sterilely prepped and draped in standard fashion.  Time-out was called.  A 4-cm incision was made following the interval between the coracoid process and the distal third of the clavicle following Langer's lines.  Skin flaps were elevated and electrocautery was used for hemostasis.  Dissection was carried deeply and then, we created a medial- to-lateral split separating the clavipectoral fascia allowing exposure of the distal 3-4 cm of clavicle superiorly and then dissection anteriorly beneath the deltoid allowing us to gain access beneath the clavicle and then the traumatic injury left an obvious interval where the coracoclavicular ligaments had been disrupted and hematoma was evacuated from this area and allowed direct dissection down to the apex of the coracoid.  The coracoid was then exposed using blunt and sharp dissection and electrocautery.  Such that, we clearly visualized the waist of the coracoid.  I then placed a guidepin and then reamed this with a 4.5 reamer through the center of the coracoid, creating appropriate bone tunnel.  I then performed a similar tunnel perforation through the clavicle approximately 3.5 cm from its distal end, corresponding the proper  location for reconstruction of the coracoclavicular ligament.  At this point, we then packed the wound on the back table.  We then prepared the allograft, placing a whipstitch of #2 FiberWire through the doubled end of the semitendinosus allograft. At this point, we then placed our double ZipLoop through the clavicular bone tunnel  and then the appropriate button was progressed through the coracoid tunnel and slipped, and we used fluoroscopic imaging to confirm that the metal button had flipped appropriately beneath the coracoid. The ZipLoop was then tensioned.  We passed our allograft through the appropriate ZipLoop and tensioned the first ZipLoop and this brought the allograft into excellent reapposition against the dorsal aspect of the coracoid to the raw bony area that had been prepared.  At this point, the medial limb of the allograft was then passed posterior to the clavicle.  Once the first ZipLoop was properly tightened, the tightening limbs were then removed with a 15-blade.  At this point, second ZipLoop was then prepared and the metal button over the clavicle was then placed into position.  We then sequentially tightened the second ZipLoop and allowed reduction of the clavicle towards the coracoid and with compression across this interval applied manually, I then terminally tightened the ZipLoop construct allowing excellent fixation and stability.  We then obtained additional intraoperative fluoroscopic images, which confirmed that the metal buttons were properly positioned and that the clavicle had been repositioned appropriately.  Overall alignment was much to our satisfaction.  At this point, we then utilized the suture limbs off the ZipLoop, then tied the limbs of the allograft across the dorsal aspect of the clavicle allowing excellent fixation and properly tensioning the allograft for biologic incorporation.  I then placed an additional whipstitch anteriorly to tying the end of the allograft to one another again under appropriate tension and completing the construct, which was much to our satisfaction.  The wound was then irrigated.  Hemostasis was obtained.  Then repaired the clavipectoral fascial interval, then repaired the deltotrapezial fascial interval across the apex of the clavicle using #1  Vicryl allowing excellent reapposition of the soft tissue across the apex of the clavicle.  A 2-0 Vicryl was then used to close the subcu layer and intracuticular 3-0 Monocryl for the skin followed by Steri-Strips and dry dressing.  Right arm was then placed into a sling.  The patient was awakened, extubated, and taken to the recovery room in stable condition.  Ralene Bathe, PA-C, was used as an Geophysicist/field seismologist throughout this case, essential for help with positioning of the patient, positioning of the extremity, management of the tissue retractors, preparation of the graft, wound closure, and intraoperative decision making.     Vania Rea. Charlee Squibb, M.D.     KMS/MEDQ  D:  05/02/2014  T:  05/03/2014  Job:  [id number]

## 2014-05-09 ENCOUNTER — Encounter (HOSPITAL_COMMUNITY): Payer: Self-pay | Admitting: Orthopedic Surgery

## 2014-05-09 NOTE — OR Nursing (Signed)
Added implant to record 05/09/2013 @ 227pm per Dixie DialsAngela Williamson RN

## 2015-11-22 IMAGING — CR DG SHOULDER 2+V*R*
3 series · 3 of 3 positions shown · non-contrast
Comparison: None.

CLINICAL DATA: Initial evaluation for recent skiing injury. Trauma.
Now with right shoulder pain.

EXAM:
RIGHT SHOULDER - 2+ VIEW

[w shoulder external right]
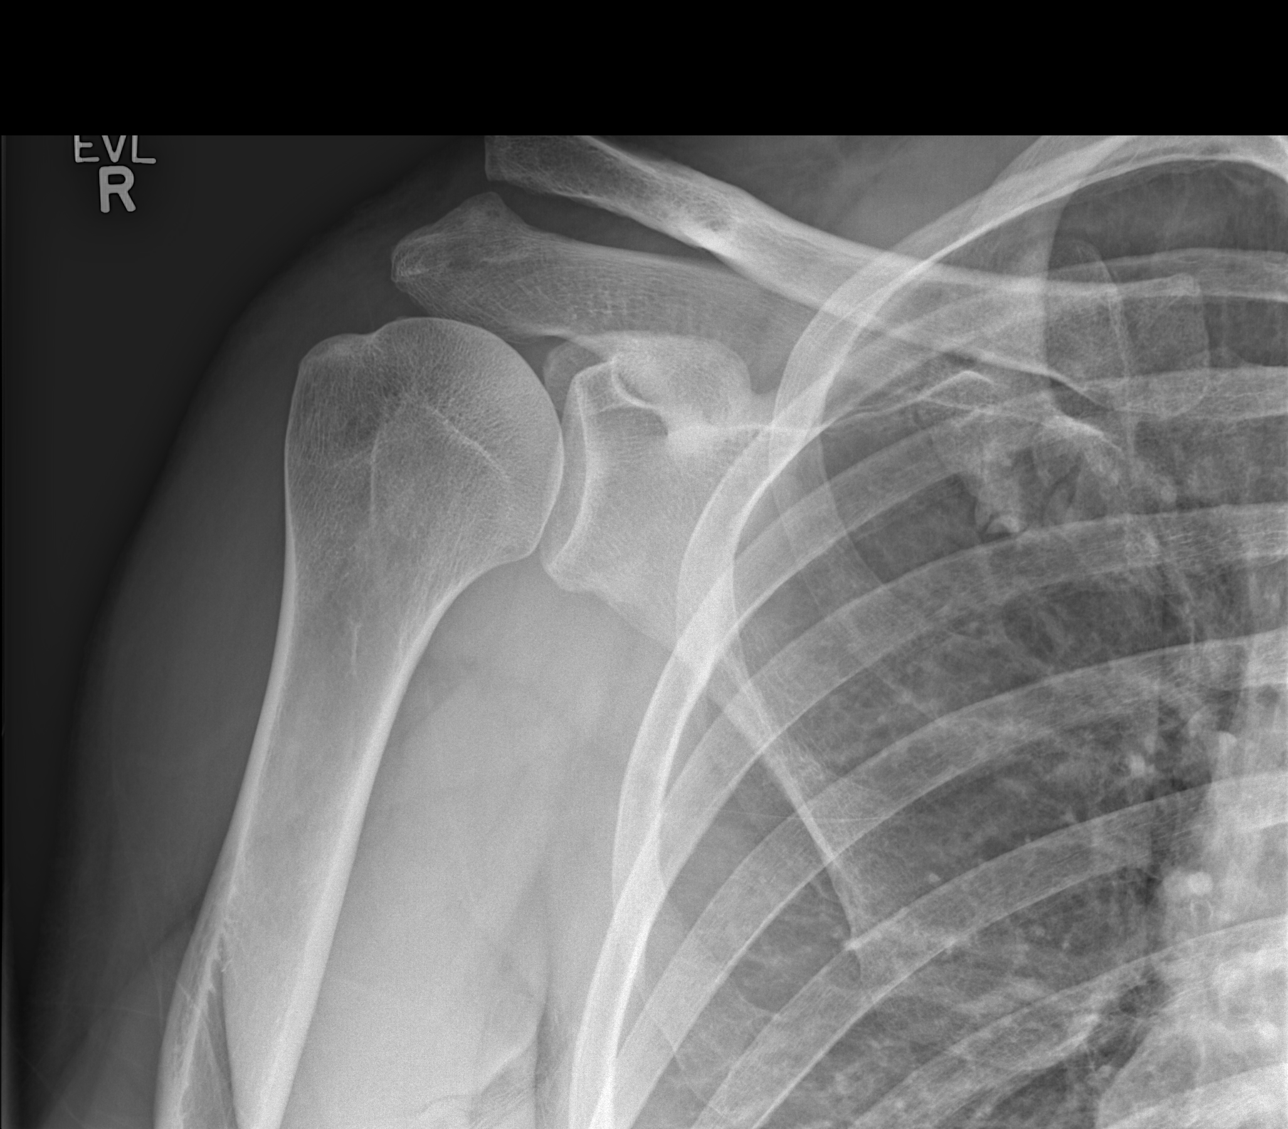

[w shoulder internal right]
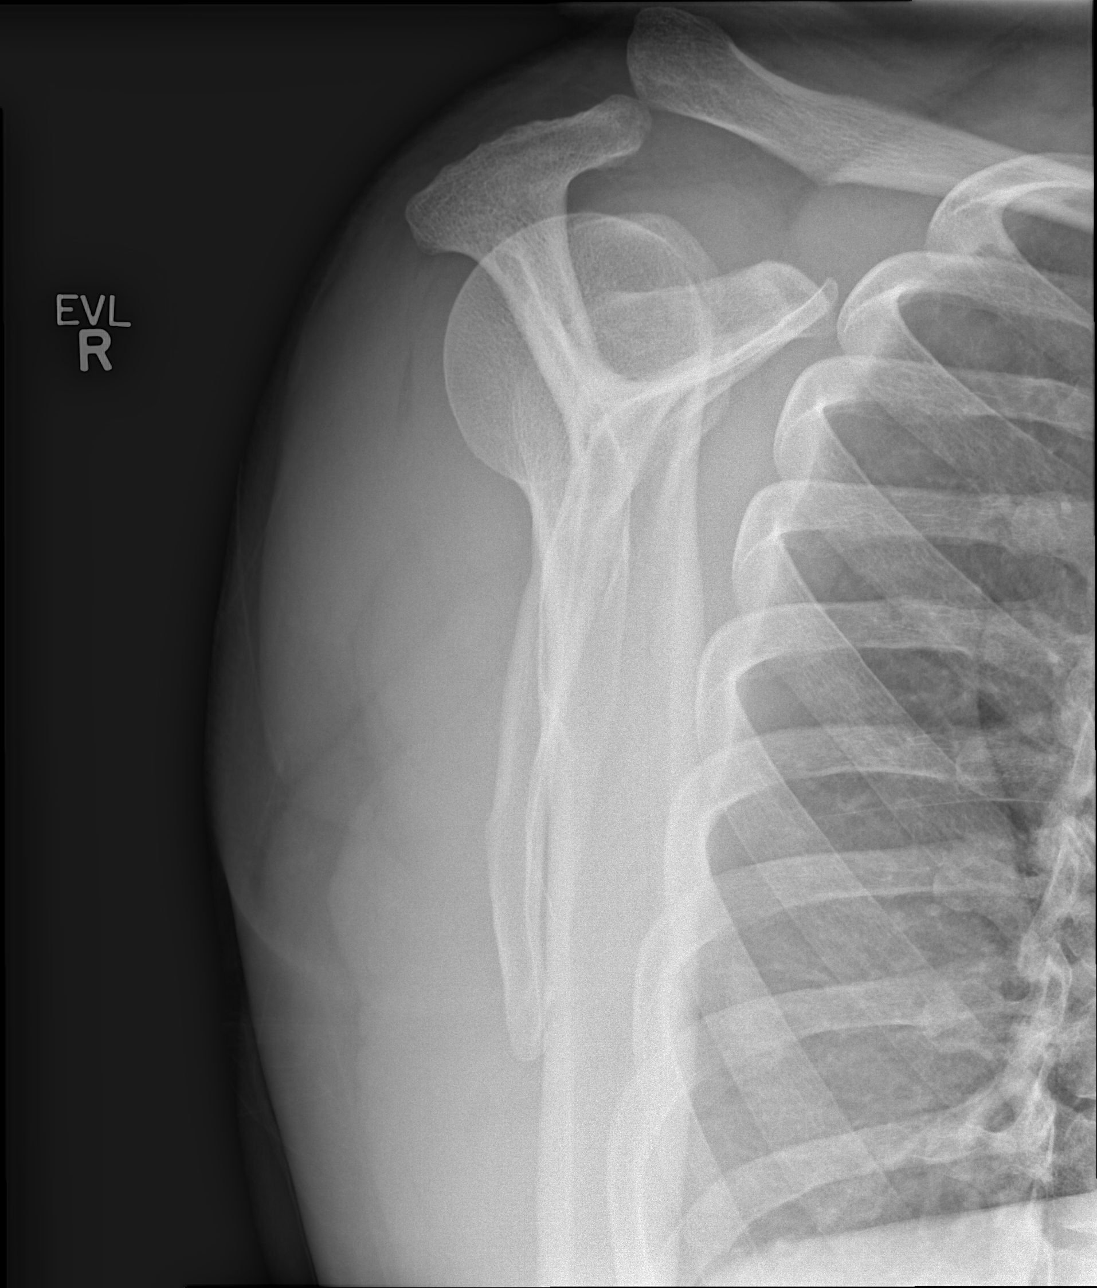

[x shoulder axillary right]
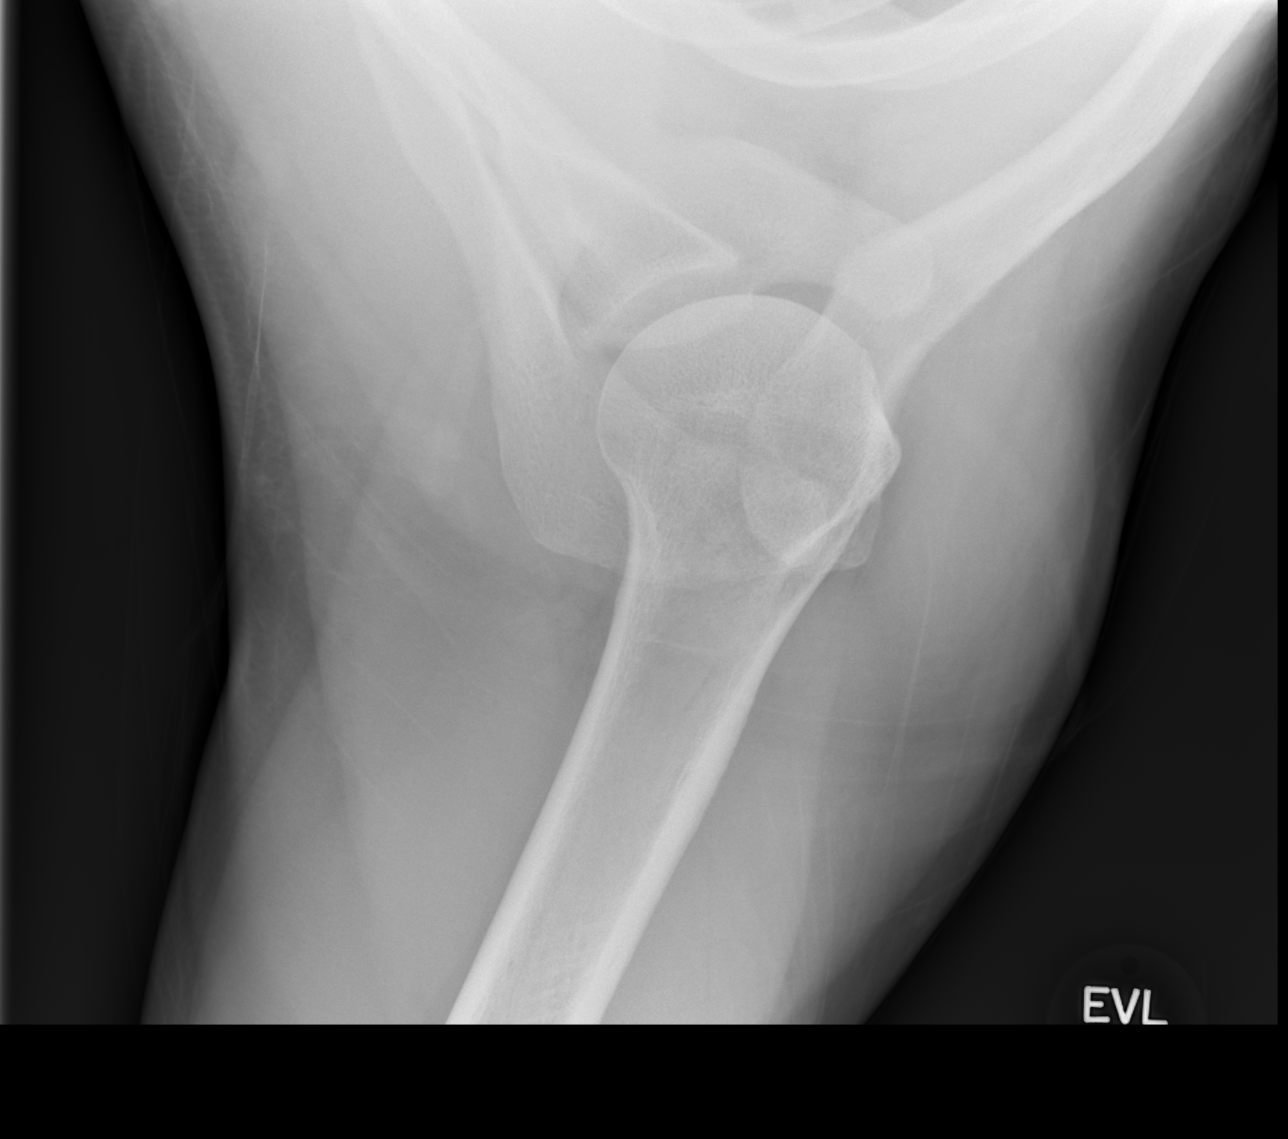

[3 of 3 positions shown; findings below may reference images not displayed]

FINDINGS: No acute fracture identified. The humeral head in normal alignment
with the glenoid. There is elevation of the distal clavicle relative
to the acromion with widening of the cortical clavicular distance to
26 mm. Findings suggestive of acromioclavicular
separation/dislocation. No soft tissue abnormality. No radiopaque
foreign body. Partially visualized right hemi thorax grossly clear.
IMPRESSION: 1. Elevation of the distal clavicle relative to the acromion with
widening of the coracoclavicular distance, most consistent with AC
separation/dislocation (type III).
2. No acute fracture.

## 2015-11-26 IMAGING — RF DG C-ARM 61-120 MIN
1 series · 1 of 1 positions shown · non-contrast
Comparison: Shoulder radiographs 04/28/2014

CLINICAL DATA: RIGHT AC joint ligament repair

EXAM:
DG C-ARM 61-120 MIN; RIGHT CLAVICLE - 2+ VIEWS
FLUOROSCOPY TIME:  0 min 11 seconds

[Series 1: run · 1 of 1 slices shown]
[im 1/1]
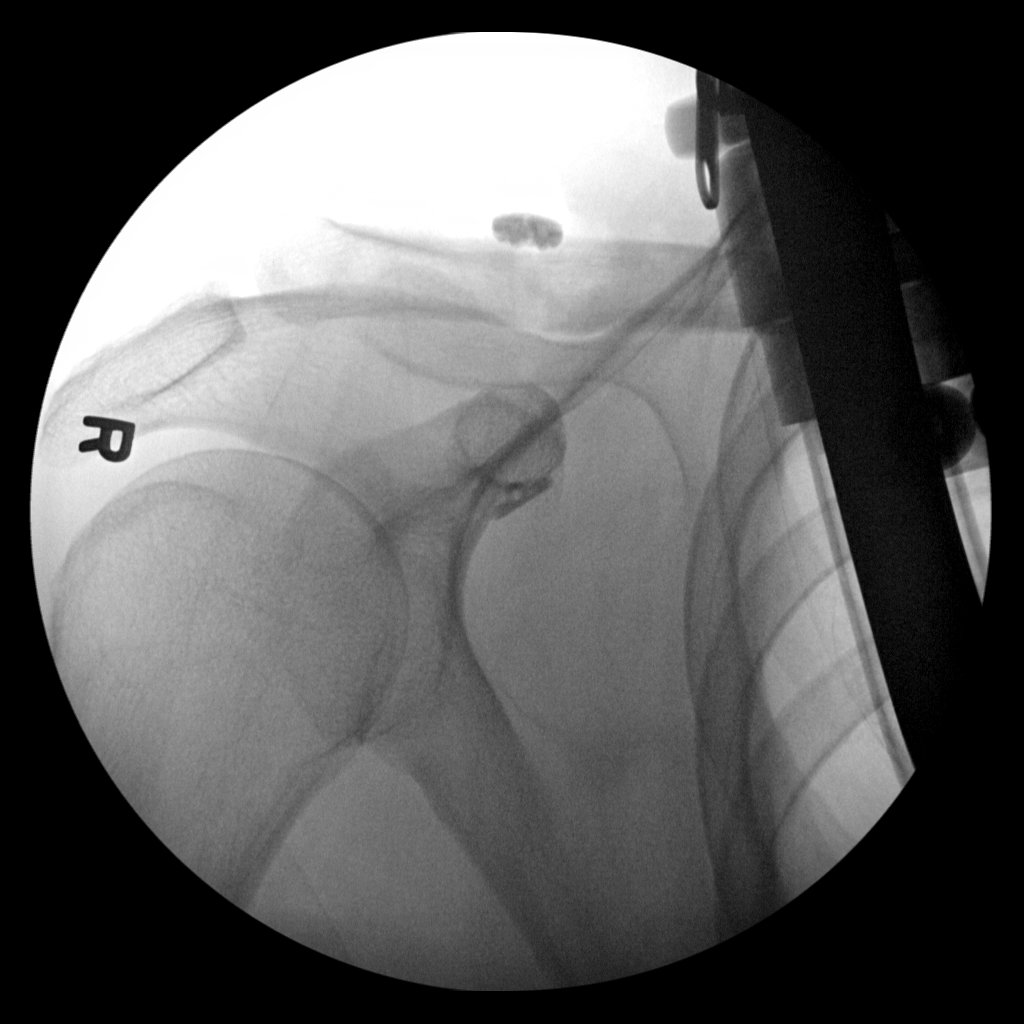

[1 of 1 positions shown; findings below may reference images not displayed]

FINDINGS: Single digital C-arm fluoroscopic image 18 intraoperatively
demonstrates an anchor at the dorsal margin of the distal RIGHT
clavicle post coracoclavicular ligament repair/reconstruction.

RIGHT AC joint alignment appears normal.

No fractures or dislocation identified.
IMPRESSION: Post RIGHT coracoclavicular ligament repair/reconstruction.

Normal alignment of RIGHT AC joint on single view.
